# Patient Record
Sex: Male | Born: 1965 | Race: Black or African American | Hispanic: No | Marital: Married
Health system: Southern US, Community
[De-identification: ages and names within clinical notes are randomized; demographics above are authoritative.]

## PROBLEM LIST (undated history)

## (undated) DIAGNOSIS — I10 Essential (primary) hypertension: Secondary | ICD-10-CM

## (undated) DIAGNOSIS — E785 Hyperlipidemia, unspecified: Secondary | ICD-10-CM

---

## 2020-06-04 ENCOUNTER — Ambulatory Visit: Payer: Self-pay | Admitting: Family Medicine

## 2020-08-26 ENCOUNTER — Other Ambulatory Visit: Payer: Self-pay | Admitting: Internal Medicine

## 2020-08-26 DIAGNOSIS — M545 Low back pain, unspecified: Secondary | ICD-10-CM

## 2020-08-26 DIAGNOSIS — G8929 Other chronic pain: Secondary | ICD-10-CM

## 2020-08-26 DIAGNOSIS — M25561 Pain in right knee: Secondary | ICD-10-CM

## 2020-08-26 DIAGNOSIS — M542 Cervicalgia: Secondary | ICD-10-CM

## 2020-10-11 ENCOUNTER — Ambulatory Visit
Admission: RE | Admit: 2020-10-11 | Discharge: 2020-10-11 | Disposition: A | Discharge status: Home / Self Care | Payer: Disability Insurance | Attending: Internal Medicine | Admitting: Internal Medicine

## 2020-10-11 ENCOUNTER — Ambulatory Visit
Admission: RE | Admit: 2020-10-11 | Discharge: 2020-10-11 | Disposition: A | Discharge status: Home / Self Care | Payer: Disability Insurance | Source: Ambulatory Visit | Attending: Internal Medicine | Admitting: Internal Medicine

## 2020-10-11 DIAGNOSIS — M545 Low back pain, unspecified: Secondary | ICD-10-CM

## 2020-10-11 DIAGNOSIS — M25561 Pain in right knee: Secondary | ICD-10-CM | POA: Insufficient documentation

## 2020-10-11 DIAGNOSIS — M542 Cervicalgia: Secondary | ICD-10-CM | POA: Insufficient documentation

## 2020-10-11 DIAGNOSIS — M25562 Pain in left knee: Secondary | ICD-10-CM | POA: Diagnosis present

## 2020-10-11 DIAGNOSIS — G8929 Other chronic pain: Secondary | ICD-10-CM

## 2022-04-30 IMAGING — CR DG KNEE 1-2V*L*
1 series · 2 of 2 positions shown · non-contrast
Comparison: None.

CLINICAL DATA: Chronic bilateral knee pain.  Prior meniscal repair.

EXAM:
LEFT KNEE - 1-2 VIEW

[Series 1: dg knee 1-2 views left · 0.14mm/px · 2 of 2 slices shown]
[im 1/2]
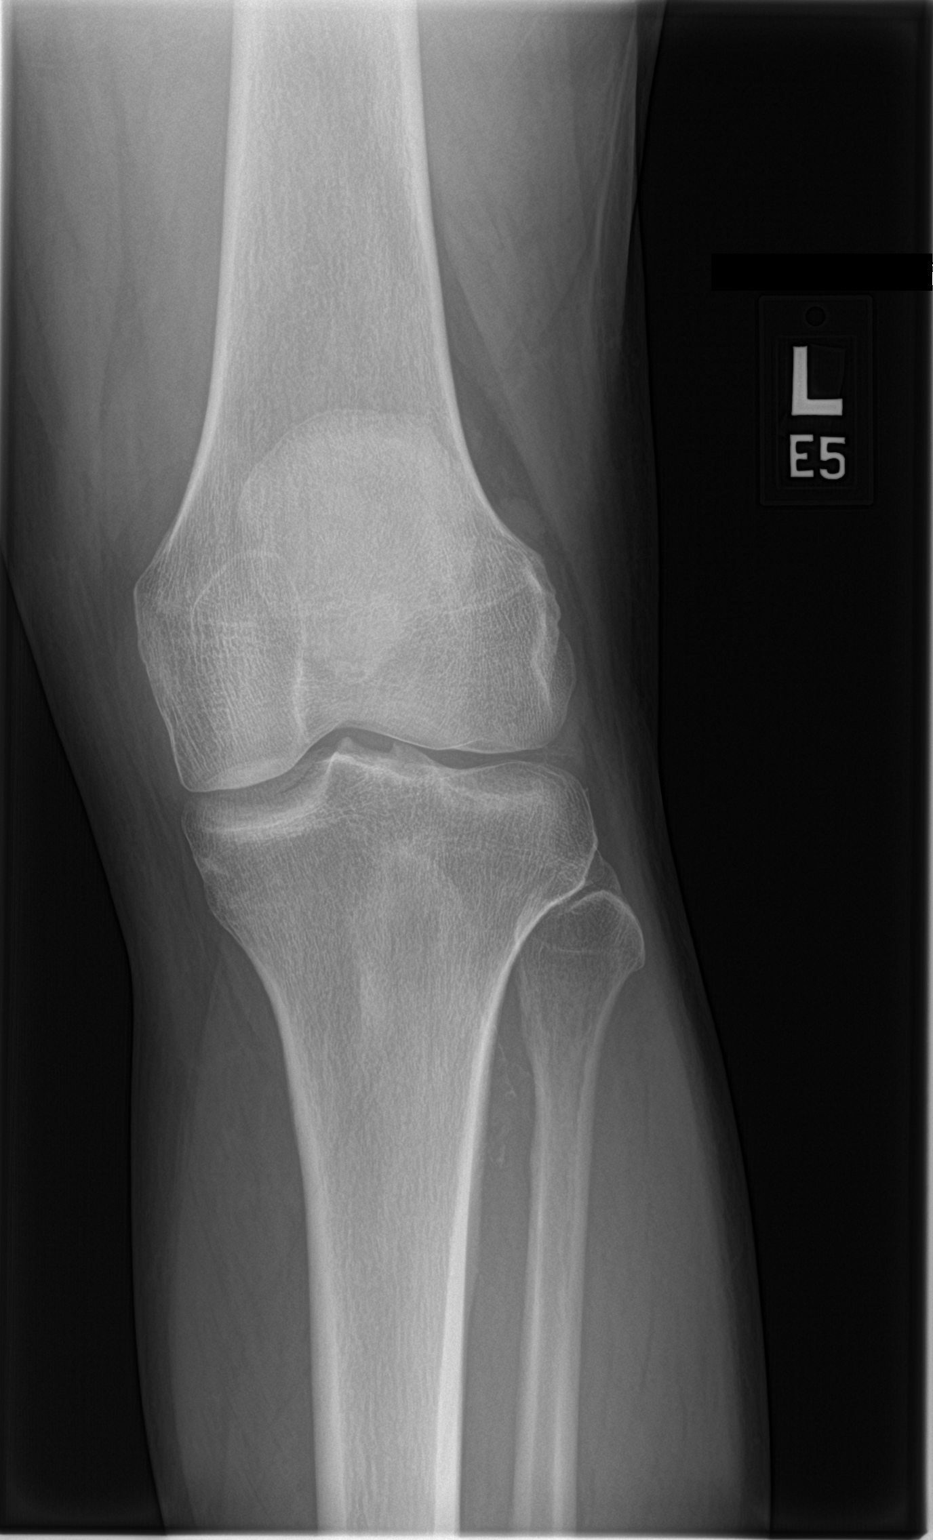
[im 2/2]
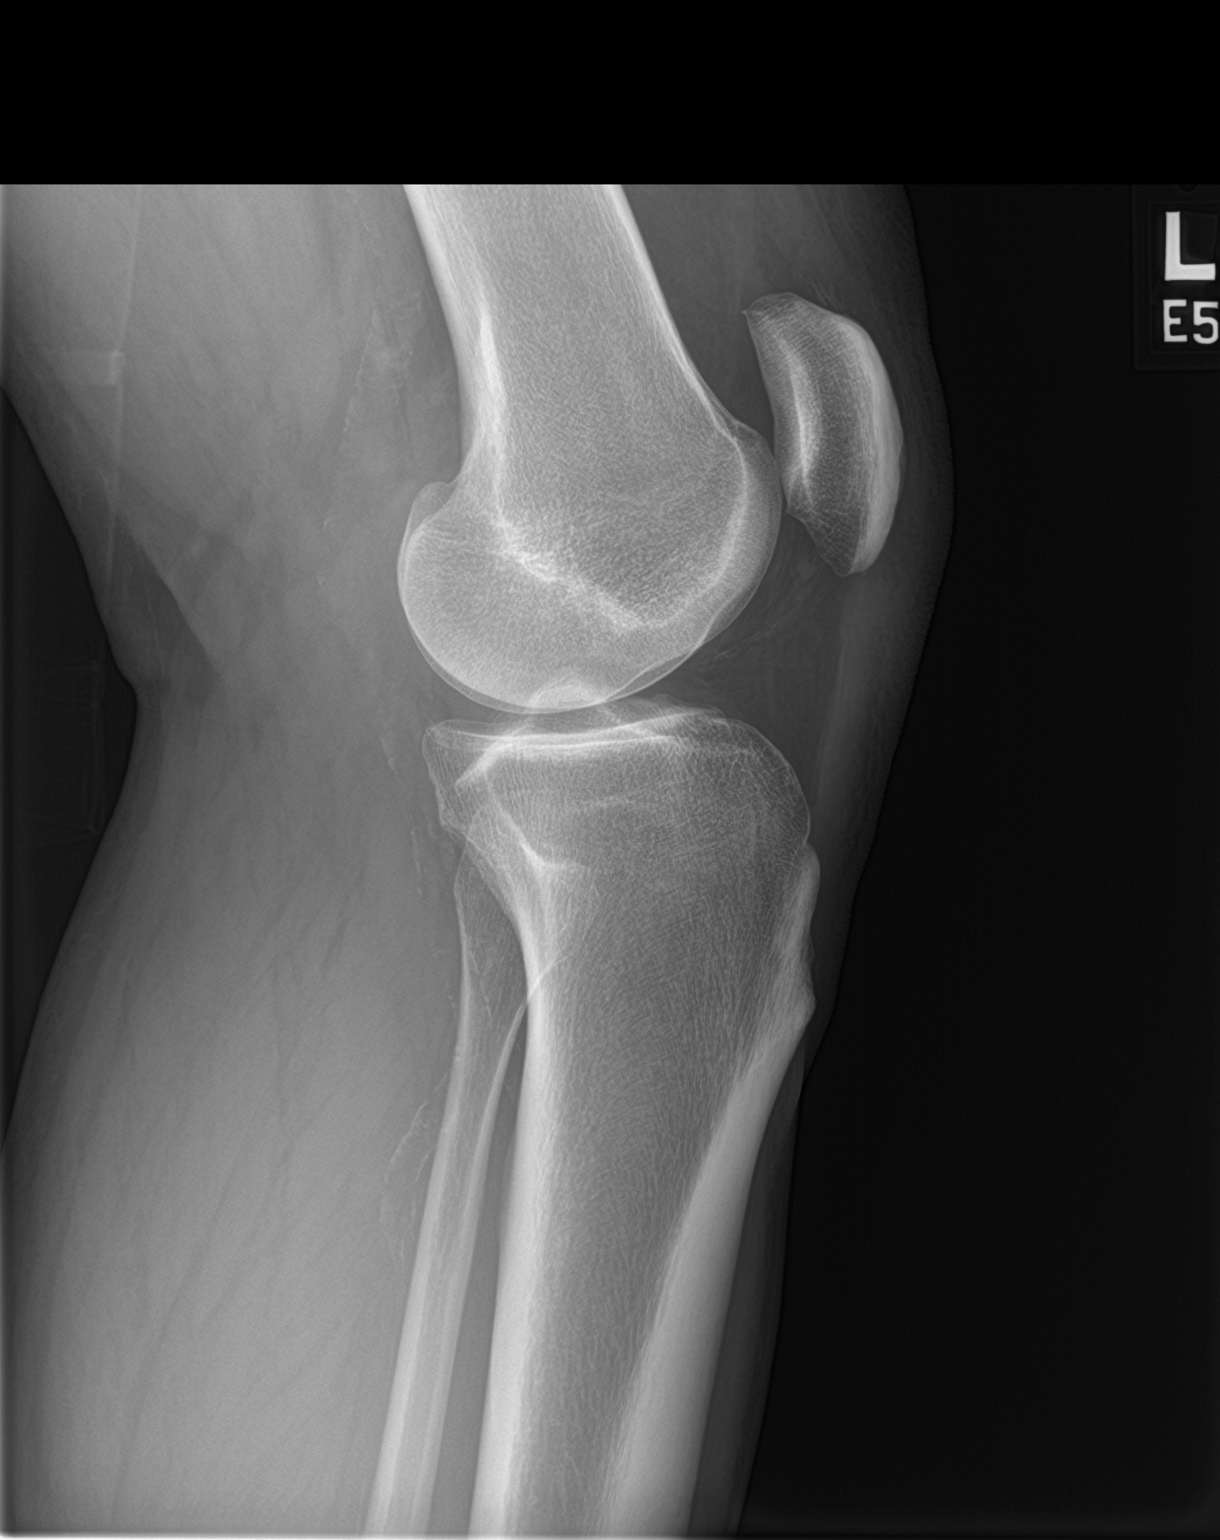

[2 of 2 positions shown; findings below may reference images not displayed]

FINDINGS: Standing AP and lateral views obtained. Normal joint spaces and
alignment. Trace superior patellar spurring and spurring of the
tibial spines. No erosion or bone destruction. No joint effusion. No
focal soft tissue abnormality. There are vascular calcifications.
IMPRESSION: Minimal degenerative change with spurring of the patellofemoral
compartment and tibial spines.

## 2022-12-23 ENCOUNTER — Emergency Department: Payer: No Typology Code available for payment source

## 2022-12-23 ENCOUNTER — Emergency Department
Admission: EM | Admit: 2022-12-23 | Discharge: 2022-12-23 | Disposition: A | Discharge status: Home / Self Care | Payer: No Typology Code available for payment source | Attending: Emergency Medicine | Admitting: Emergency Medicine

## 2022-12-23 ENCOUNTER — Other Ambulatory Visit: Payer: Self-pay

## 2022-12-23 ENCOUNTER — Ambulatory Visit: Payer: Self-pay

## 2022-12-23 DIAGNOSIS — R200 Anesthesia of skin: Secondary | ICD-10-CM | POA: Insufficient documentation

## 2022-12-23 DIAGNOSIS — R42 Dizziness and giddiness: Secondary | ICD-10-CM | POA: Diagnosis not present

## 2022-12-23 LAB — CBC
HCT: 39.9 % (ref 39.0–52.0)
Hemoglobin: 13.9 g/dL (ref 13.0–17.0)
MCH: 29.4 pg (ref 26.0–34.0)
MCHC: 34.8 g/dL (ref 30.0–36.0)
MCV: 84.5 fL (ref 80.0–100.0)
Platelets: 191 10*3/uL (ref 150–400)
RBC: 4.72 MIL/uL (ref 4.22–5.81)
RDW: 11.8 % (ref 11.5–15.5)
WBC: 2.9 10*3/uL — ABNORMAL LOW (ref 4.0–10.5)
nRBC: 0 % (ref 0.0–0.2)

## 2022-12-23 LAB — BASIC METABOLIC PANEL
Anion gap: 8 (ref 5–15)
BUN: 8 mg/dL (ref 6–20)
CO2: 23 mmol/L (ref 22–32)
Calcium: 8.5 mg/dL — ABNORMAL LOW (ref 8.9–10.3)
Chloride: 103 mmol/L (ref 98–111)
Creatinine, Ser: 0.87 mg/dL (ref 0.61–1.24)
GFR, Estimated: >60 mL/min (ref >60)
Glucose, Bld: 168 mg/dL — ABNORMAL HIGH (ref 70–99)
Potassium: 3.2 mmol/L — ABNORMAL LOW (ref 3.5–5.1)
Sodium: 134 mmol/L — ABNORMAL LOW (ref 135–145)

## 2022-12-23 MED ORDER — LORAZEPAM 1 MG PO TABS
1.0000 mg | ORAL_TABLET | Freq: Once | ORAL | Status: AC | PRN
  Administered 2022-12-23: 1 mg via ORAL
  Filled 2022-12-23: qty 1

## 2022-12-23 MED ORDER — POTASSIUM CHLORIDE CRYS ER 20 MEQ PO TBCR
40.0000 meq | EXTENDED_RELEASE_TABLET | Freq: Once | ORAL | Status: AC
  Administered 2022-12-23: 40 meq via ORAL
  Filled 2022-12-23: qty 2

## 2022-12-23 NOTE — ED Provider Notes (Addendum)
[facility] Provider Note    Event Date/Time   First MD Initiated Contact with Patient 12/23/22 1056     (approximate)   History   Chief Complaint: Dizziness   HPI  Peter Colon is a 57 y.o. male with a past history of positional vertigo who comes ED complaining of dizziness for the past 4 days.  Reports difficulty with balance during this time.  He attempted Epley maneuver at home which initially made him more dizzy but then afterward the dizziness resolved.  Currently reports being asymptomatic except for having some intermittent left hand numbness as well over the past few days.     Physical Exam   Triage Vital Signs: ED Triage Vitals  Encounter Vitals Group     BP 12/23/22 1028 (!) 155/97     Systolic BP Percentile --      Diastolic BP Percentile --      Pulse Rate 12/23/22 1028 76     Resp 12/23/22 1028 18     Temp 12/23/22 1028 (!) 97.5 F (36.4 C)     Temp src --      SpO2 12/23/22 1028 97 %     Weight 12/23/22 1029 210 lb (95.3 kg)     Height 12/23/22 1029 5\' 11"  (1.803 m)     Head Circumference --      Peak Flow --      Pain Score 12/23/22 1029 0     Pain Loc --      Pain Education --      Exclude from Growth Chart --     Most recent vital signs: Vitals:   12/23/22 1310 12/23/22 1503  BP: (!) 155/92 (!) 151/93  Pulse: 64 66  Resp: 18 15  Temp:  97.6 F (36.4 C)  SpO2: 98% 99%    General: Awake, no distress.  CV:  Good peripheral perfusion.  Regular rate rhythm Resp:  Normal effort.  Clear to auscultation bilaterally Abd:  No distention.  Soft nontender Other:  Cranial nerves III through XII intact.  No drift.  Normal speech and language.  No nystagmus.   ED Results / Procedures / Treatments   Labs (all labs ordered are listed, but only abnormal results are displayed) Labs Reviewed  CBC - Abnormal; Notable for the following components:      Result Value   WBC 2.9 (*)    All other components within normal limits   BASIC METABOLIC PANEL - Abnormal; Notable for the following components:   Sodium 134 (*)    Potassium 3.2 (*)    Glucose, Bld 168 (*)    Calcium 8.5 (*)    All other components within normal limits     EKG Interpreted by me Normal sinus rhythm, rate of 78.  Normal axis and intervals.  Normal QRS ST segments and T waves   RADIOLOGY CT head interpreted by me, negative for mass or intracranial hemorrhage.  Radiology report reviewed  MRI negative for stroke or other acute findings   PROCEDURES:  Procedures   MEDICATIONS ORDERED IN ED: Medications  LORazepam (ATIVAN) tablet 1 mg (1 mg Oral Given 12/23/22 1310)  potassium chloride SA (KLOR-CON M) CR tablet 40 mEq (40 mEq Oral Given 12/23/22 1429)     IMPRESSION / MDM / ASSESSMENT AND PLAN / ED COURSE  I reviewed the triage vital signs and the nursing notes.  DDx: Peripheral vertigo, cerebellar CVA, anemia, AKI, electrolyte abnormality  Patient's presentation is most consistent  with acute presentation with potential threat to life or bodily function.  Patient presents with episode of dizziness that lasted a few days, resolved after doing a Epley maneuver at home.  Currently neuroexam is unremarkable.  Will obtain MRI, if negative stable for outpatient follow-up.  Will replace low potassium.   Clinical Course as of 12/23/22 1510  Wed Dec 23, 2022  1432 MR BRAIN WO CONTRAST [AW]    Clinical Course User Index [AW] Virginia Rochester, Student-PA    ----------------------------------------- 3:10 PM on 12/23/2022 ----------------------------------------- MRI normal.  Patient reports symptoms remain resolved, ambulatory with steady gait.  Stable for discharge.   FINAL CLINICAL IMPRESSION(S) / ED DIAGNOSES   Final diagnoses:  Vertigo     Rx / DC Orders   ED Discharge Orders     None        Note:  This document was prepared using Dragon voice recognition software and may include unintentional dictation errors.    Sharman Cheek, MD 12/23/22 1406    Sharman Cheek, MD 12/23/22 [phone]

## 2022-12-23 NOTE — ED Triage Notes (Addendum)
Pt to ED for dizziness for 4 days. Reports altered gait. Reports dizziness improving. Ambulatory to triage. Reports intermittent numbness to left hand for 2 days

## 2022-12-23 NOTE — Discharge Instructions (Addendum)
Your CT scan and MRI today were normal.  Please continue to follow-up with your doctor.

## 2023-08-14 ENCOUNTER — Emergency Department
Admission: EM | Admit: 2023-08-14 | Discharge: 2023-08-14 | Disposition: A | Discharge status: Home / Self Care | Attending: Emergency Medicine | Admitting: Emergency Medicine

## 2023-08-14 ENCOUNTER — Emergency Department

## 2023-08-14 ENCOUNTER — Other Ambulatory Visit: Payer: Self-pay

## 2023-08-14 DIAGNOSIS — R0789 Other chest pain: Secondary | ICD-10-CM | POA: Insufficient documentation

## 2023-08-14 DIAGNOSIS — R079 Chest pain, unspecified: Secondary | ICD-10-CM | POA: Diagnosis present

## 2023-08-14 DIAGNOSIS — E876 Hypokalemia: Secondary | ICD-10-CM | POA: Diagnosis not present

## 2023-08-14 DIAGNOSIS — I10 Essential (primary) hypertension: Secondary | ICD-10-CM | POA: Insufficient documentation

## 2023-08-14 HISTORY — DX: Essential (primary) hypertension: I10

## 2023-08-14 HISTORY — DX: Hyperlipidemia, unspecified: E78.5

## 2023-08-14 LAB — HEPATIC FUNCTION PANEL
ALT: 37 U/L (ref 0–44)
AST: 24 U/L (ref 15–41)
Albumin: 3.9 g/dL (ref 3.5–5.0)
Alkaline Phosphatase: 45 U/L (ref 38–126)
Bilirubin, Direct: 0.1 mg/dL (ref 0.0–0.2)
Indirect Bilirubin: 0.5 mg/dL (ref 0.3–0.9)
Total Bilirubin: 0.6 mg/dL (ref 0.0–1.2)
Total Protein: 6.6 g/dL (ref 6.5–8.1)

## 2023-08-14 LAB — CBC
HCT: 40 % (ref 39.0–52.0)
Hemoglobin: 13.8 g/dL (ref 13.0–17.0)
MCH: 30.4 pg (ref 26.0–34.0)
MCHC: 34.5 g/dL (ref 30.0–36.0)
MCV: 88.1 fL (ref 80.0–100.0)
Platelets: 215 10*3/uL (ref 150–400)
RBC: 4.54 MIL/uL (ref 4.22–5.81)
RDW: 11.8 % (ref 11.5–15.5)
WBC: 5.3 10*3/uL (ref 4.0–10.5)
nRBC: 0 % (ref 0.0–0.2)

## 2023-08-14 LAB — BASIC METABOLIC PANEL
Anion gap: 11 (ref 5–15)
BUN: 9 mg/dL (ref 6–20)
CO2: 22 mmol/L (ref 22–32)
Calcium: 8.9 mg/dL (ref 8.9–10.3)
Chloride: 104 mmol/L (ref 98–111)
Creatinine, Ser: 0.87 mg/dL (ref 0.61–1.24)
GFR, Estimated: >60 mL/min (ref >60)
Glucose, Bld: 135 mg/dL — ABNORMAL HIGH (ref 70–99)
Potassium: 3.2 mmol/L — ABNORMAL LOW (ref 3.5–5.1)
Sodium: 137 mmol/L (ref 135–145)

## 2023-08-14 LAB — LIPASE, BLOOD: Lipase: 29 U/L (ref 11–51)

## 2023-08-14 LAB — TROPONIN I (HIGH SENSITIVITY)
Troponin I (High Sensitivity): 4 ng/L (ref <18)
Troponin I (High Sensitivity): 3 ng/L (ref <18)

## 2023-08-14 LAB — MAGNESIUM: Magnesium: 1.8 mg/dL (ref 1.7–2.4)

## 2023-08-14 NOTE — Discharge Instructions (Signed)
 You are seen in the emergency department for chest pain.  Your blood pressure was elevated.  Your chest x-ray did not show any abnormalities.  You had 2 heart enzymes that were normal, do not believe you are having a big heart attack today.  It is important that you work on decreasing and stopping your alcohol use.  Follow-up closely, call tomorrow with your primary care physician to schedule close follow-up and discuss further chest pain workup.  Return to the emergency department if you have any worsening chest pain.  Your blood pressure was significantly elevated in the emergency department.  Start taking your amlodipine.  Take as prescribed.  Your potassium was mildly low when checked today.  Make sure to follow up with a primary doctor to follow up your labs.  Make sure to eat food high in potassium and magnesium - examples - potatoes, spinach, bananas, beans, avocadoes, oranges, nuts.

## 2023-08-14 NOTE — ED Provider Notes (Signed)
 [facility] Provider Note    Event Date/Time   First MD Initiated Contact with Patient 08/14/23 [phone]     (approximate)   History   Chest Pain   HPI  Peter Colon is a 58 y.o. male past medical history significant for hypertension, hyperlipidemia, alcohol use, who presents to the emergency department with chest pain.  Patient states that he fast every day and does not drink anything except for water or had some champagne this morning until 1 PM.  States that he started to not feel right and like he might be dehydrated.  States that he took his blood pressure at home and it was significantly elevated.  States that he continued to take it and it went down to the 140s however it then went back up to 190 systolic which concerned him.  Denies any chest pain, diaphoresis, shortness of breath, nausea or vomiting at this time.  States that he had some mild chest discomfort on the left side earlier today but does not have any at this time.  States that he has been told in the past that he has had hypertension but has not yet started on any antihypertensive medications.  Denies any tearing chest pain.  Does endorse daily alcohol use some last drink alcohol was this morning.  Denies any history of DVT or PE.  No prior stress testing or cardiac catheterization.  No family history of coronary artery disease at a young age.  Denies any tobacco use.  Denies ever getting chest pain or shortness of breath whenever he is working out and states he works out on a regular basis.     Physical Exam   Triage Vital Signs: ED Triage Vitals  Encounter Vitals Group     BP 08/14/23 1731 (!) 152/94     Systolic BP Percentile --      Diastolic BP Percentile --      Pulse Rate 08/14/23 1731 72     Resp 08/14/23 1731 18     Temp 08/14/23 1731 98 F (36.7 C)     Temp Source 08/14/23 1731 Oral     SpO2 08/14/23 1731 97 %     Weight --      Height --      Head Circumference --      Peak  Flow --      Pain Score 08/14/23 1726 3     Pain Loc --      Pain Education --      Exclude from Growth Chart --     Most recent vital signs: Vitals:   08/14/23 2037 08/14/23 2117  BP: (!) 160/89   Pulse: 66 73  Resp: 19 18  Temp:    SpO2: 100% 99%    Physical Exam Constitutional:      Appearance: He is well-developed.  HENT:     Head: Atraumatic.  Eyes:     Conjunctiva/sclera: Conjunctivae normal.  Cardiovascular:     Rate and Rhythm: Regular rhythm.     Pulses:          Radial pulses are 2+ on the right side and 2+ on the left side.       Posterior tibial pulses are 2+ on the right side and 2+ on the left side.     Heart sounds: Normal heart sounds. No murmur heard. Pulmonary:     Effort: No respiratory distress.  Chest:     Chest wall: Tenderness (Focal tenderness to  the left chest wall.) present.  Abdominal:     Tenderness: There is no abdominal tenderness.  Musculoskeletal:        General: Normal range of motion.     Cervical back: Normal range of motion.     Right lower leg: No edema.     Left lower leg: No edema.  Skin:    General: Skin is warm.     Capillary Refill: Capillary refill takes less than 2 seconds.  Neurological:     Mental Status: He is alert. Mental status is at baseline.     IMPRESSION / MDM / ASSESSMENT AND PLAN / ED COURSE  I reviewed the triage vital signs and the nursing notes.  Differential diagnosis including ACS, gastritis/PUD, hypertension, pneumonia, pancreatitis.  Low risk Wells criteria have a low suspicion for pulmonary embolism.  No tearing chest pain, have a low suspicion for dissection, intact and symmetric pulses.  EKG  I, Corena Herter, the attending physician, personally viewed and interpreted this ECG.   Rate: Normal  Rhythm: Normal sinus  Axis: Normal  Intervals: Normal  ST&T Change: None. Flattening of T wave inferior leads. No significant change from prior.   No tachycardic or bradycardic dysrhythmias while on  cardiac telemetry.  RADIOLOGY I independently reviewed imaging, my interpretation of imaging: No signs of pneumonia.  No cardiomegaly.  Read as no acute findings.  LABS (all labs ordered are listed, but only abnormal results are displayed) Labs interpreted as -    Labs Reviewed  BASIC METABOLIC PANEL - Abnormal; Notable for the following components:      Result Value   Potassium 3.2 (*)    Glucose, Bld 135 (*)    All other components within normal limits  CBC  HEPATIC FUNCTION PANEL  MAGNESIUM  LIPASE, BLOOD  TROPONIN I (HIGH SENSITIVITY)  TROPONIN I (HIGH SENSITIVITY)     MDM    Lab work with mild hypokalemia at 3.2 that appears chronic.  No leukocytosis or anemia.  Initial troponin negative at 4.  Normal LFTs and T. bili.  Serial troponin negative.  Patient remains chest pain-free.  Heart score of 3.  Low suspicion for ACS.  Low suspicion for dissection, on reevaluation patient continues to be asymptomatic with no tearing chest pain and symmetric pulses.  Patient has been prescribed amlodipine but has never started to take this medication but states he does have it at home.  Discussed taking his home dose tonight, offered in the emergency department but states he will take it as soon as he gets home.  [facility] and [facility] his [facility] use.  Discussed return to the emergency department if you have any return of symptoms.  No questions at time of discharge.   PROCEDURES:  Critical Care performed: No  Procedures  Patient's presentation is most consistent with acute presentation with potential threat to life or bodily function.   MEDICATIONS ORDERED IN ED: Medications - No data to display  FINAL CLINICAL IMPRESSION(S) / ED DIAGNOSES   Final diagnoses:  Chest pain, unspecified type  Hypokalemia  Hypertension, unspecified type     Rx / DC Orders   ED Discharge Orders     None        Note:  This document was  prepared using Dragon voice recognition software and may include unintentional dictation errors.   Corena Herter, MD 08/14/23 2200

## 2023-08-14 NOTE — ED Triage Notes (Signed)
 Pt from home via EMS for c/o central CP that started this morning and htn- 190/114. Pt denies N/V, blurry vision, headache, SHOB. Pt not currently on BP medication. 324 aspirin given 0.4 mg nitroglycerin given w/ reduction of pain 18 gauge left AC placed 140/76 80 NSR 100% RA Pt AOX4, NAD noted
